# Patient Record
Sex: Male | Born: 1965 | Race: White | Hispanic: No
Health system: Midwestern US, Community
[De-identification: ages and names within clinical notes are randomized; demographics above are authoritative.]

---

## 2013-06-05 NOTE — Progress Notes (Signed)
Review of Systems   Constitutional: Negative.    HENT: Positive for neck pain.    Eyes: Negative.    Respiratory: Negative.    Cardiovascular: Negative.    Gastrointestinal: Negative.    Genitourinary: Negative.    Musculoskeletal: Negative for back pain and joint pain.   Skin: Negative.    Neurological: Negative.    Endo/Heme/Allergies: Negative.    Psychiatric/Behavioral: Negative.        ORTHOPEDIC EVALUATION OF TRAUMA / INJURY    History of Present Illness (HPI):   This is a:  [] Consult Visit     [x] New Patient Visit     [] Established Patient Visit  Requested by self referral  Patient's PCP is No primary provider on file.Kenneth Roberson is a 47 y.o. year old male seen for evaluation after trauma / injury regarding chief complaint of   Chief Complaint   Patient presents with   ??? Knee Pain     L       Informant patient   Setting when problem first occurred The patient had a football injury to his left knee in 1985.  He dislocated his knee and tore his ACL without surgical repair.   Mechanism unknown    Location where pain is most intense Left  General patella   Quality / Description aching   Severity / Intensity 6 on scale of 1-10   Onset 1985   Timing gradually worsening   Radiates does not radiate   Aggravating factors weight bearing, and walking.   Relieving factors avoiding painful activities   Associated manifestations Instability.  Denies erythema or warmth, numbness, tingling, weakness   Previous tests and diagnostic procedures No recent imaging.   Previous problems in this area Dislocation and tore ACL without surgical treatment.   Previous treatment Hinged knee brace.   Effect on patient's life difficulty with walking     He has been treated with bracing over the years, his knee is OK with no new changes, he wants a new brace.      Past Medical History   Diagnosis Date   ??? High cholesterol        No past surgical history on file.    Family History   Problem Relation Age of Onset   ??? Cancer        grandfather   ??? Tuberculosis       grandfather   ??? Diabetes Father    ??? Heart Disease Father    ??? High Blood Pressure Father    ??? Stroke       grandfather   ??? Allergies Child        History     Social History   ??? Marital Status: Divorced     Spouse Name: N/A     Number of Children: N/A   ??? Years of Education: N/A     Social History Main Topics   ??? Smoking status: Never Smoker    ??? Smokeless tobacco: None   ??? Alcohol Use: None   ??? Drug Use: None   ??? Sexual Activity: None     Other Topics Concern   ??? None     Social History Narrative       Current Outpatient Prescriptions   Medication Sig Dispense Refill   ??? rosuvastatin (CRESTOR) 40 MG tablet Take 40 mg by mouth every evening.       ??? aspirin 81 MG tablet Take 81 mg by mouth daily.       ???  esomeprazole Magnesium (NEXIUM) 20 MG PACK Take 20 mg by mouth daily.         No current facility-administered medications for this visit.       No Known Allergies    Review of Systems:  See above      Physical Exam:   Resp 14   Ht 5\' 10"  (1.778 m)   Wt 200 lb (90.719 kg)   BMI 28.7 kg/m2   Patient is alert, appears stated age, cooperative and no distress    Gait is Normal. The patient can bear weight on the injured extremity.     left leg/knee exam:  Leg alignment:     neutral  Quadriceps/hamstring atrophy:   no  Knee effusion:    none   Knee erythema:   no  ROM:     full and equal bilaterally  Lachman:    yes   Pivot Shift:    no  Posterior drawer:   no  Lateral patella glide at 30 deg's: 20%       With no apprehension  Medial patella glide at 30 deg's: 10mm  Increased ER at 30 deg's:  no  Varus laxity at 0 and 30 deg's: no  Valgus laxity at 0 and 30 deg's: no  Recurvatum:    no  Tenderness at:   None     Knee strength is 5/5 flexion and extension    Unless noted above, there is not any erythema, edema or cutaneous lesions of the leg.  There is no calf pain.  There is + L2-S1 motor and sensory function.  The leg is well perfused with a 2+ DP pulse.    Outside record review:  none      Impression:  left knee chronic ACL injury      Plan:  We had a lengthy discussion about the most likely diagnosis.  We also discussed operative and non-operative management options.  The patient would like to proceed with:  Continued bracing    A hinged sleeve brace was fitted/issued to the patient.    F/u prn      The patient and all family members understand and agree with the above, all questions answered.

## 2017-05-12 ENCOUNTER — Ambulatory Visit: Admit: 2017-05-12 | Discharge: 2017-05-16 | Payer: BLUE CROSS/BLUE SHIELD

## 2017-05-12 ENCOUNTER — Ambulatory Visit: Admit: 2017-05-12 | Discharge: 2017-05-12 | Payer: BLUE CROSS/BLUE SHIELD | Attending: Orthopaedic Surgery

## 2017-05-12 DIAGNOSIS — S83512D Sprain of anterior cruciate ligament of left knee, subsequent encounter: Secondary | ICD-10-CM

## 2017-05-12 DIAGNOSIS — R52 Pain, unspecified: Secondary | ICD-10-CM

## 2017-05-12 NOTE — Progress Notes (Signed)
Scribe Authentication Statement  Kenneth Roberson, scribed portions of this documentation for and in the presence of Dr. Madelin RearEric Jacion Dismore,MD on 05/12/17 at 3:02 PM.    Provider Authentication Statement  I, Kenneth Roberson, M.D., personally performed the services described in this documentation and they were scribed in my presence by the above listed scribe.   The documentation is both accurate and complete.       Review of Systems   Constitutional: Negative.    HENT: Negative.    Eyes: Negative.    Respiratory: Negative.    Cardiovascular: Negative.    Gastrointestinal: Negative.    Genitourinary: Negative.    Musculoskeletal: Negative.    Skin: Negative.    Neurological: Negative.    Endo/Heme/Allergies: Negative.    Psychiatric/Behavioral: Negative.          HPI:  Kenneth Roberson is a 51 y.o. year old male who complains of Left knee pain and popping sensation, buckling sensation     The Left knee pain assessment is:   Intensity: 1/10   Location:        patellar   Description:    aching    The symptoms started 33 years ago.  Cause of injury was  Sports injury.  Previous treatment for this episode has included bracing and nsaids.  He wants a new brace and maybe surgery at some point.    Symptoms improve with the use of a brace/sleeve. The symptoms are worse with activity, walking uneven ground.   The progression of symptoms, overall course: waxing and waning. Prior to this episode, knee history is: negative for prior surgery, trauma, arthritis or disorders    Review of previous history:  Patient's PCP is No primary provider on file.Kenneth Roberson.  Kenneth Roberson is a 51 y.o. year old male seen for evaluation after trauma / injury regarding chief complaint of        Chief Complaint   Patient presents with   . Knee Pain     L       Informant patient   Setting when problem first occurred The patient had a football injury to his left knee in 1985.  He dislocated his knee and tore his ACL without surgical repair.   Mechanism unknown     Location where pain is most intense Left  General patella   Quality / Description aching   Severity / Intensity 6 on scale of 1-10   Onset 1985   Timing gradually worsening   Radiates does not radiate   Aggravating factors weight bearing, and walking.   Relieving factors avoiding painful activities   Associated manifestations Instability.  Denies erythema or warmth, numbness, tingling, weakness   Previous tests and diagnostic procedures No recent imaging.   Previous problems in this area Dislocation and tore ACL without surgical treatment.   Previous treatment Hinged knee brace.   Effect on patient's life difficulty with walking     He has been treated with bracing over the years, his knee is OK with no new changes, he wants a new brace.    Past Medical History:   Diagnosis Date   . High cholesterol        No past surgical history on file.    Family History   Problem Relation Age of Onset   . Cancer Unknown         grandfather   . Tuberculosis Unknown         grandfather   . Diabetes Father    .  Heart Disease Father    . High Blood Pressure Father    . Stroke Unknown         grandfather   . Allergies Child        Social History     Social History   . Marital status: Divorced     Spouse name: N/A   . Number of children: N/A   . Years of education: N/A     Social History Main Topics   . Smoking status: Never Smoker   . Smokeless tobacco: Not on file   . Alcohol use Not on file   . Drug use: Unknown   . Sexual activity: Not on file     Other Topics Concern   . Not on file     Social History Narrative   . No narrative on file       Current Outpatient Prescriptions   Medication Sig Dispense Refill   . rosuvastatin (CRESTOR) 40 MG tablet Take 40 mg by mouth every evening.     Marland Kitchen. aspirin 81 MG tablet Take 81 mg by mouth daily.     Marland Kitchen. esomeprazole Magnesium (NEXIUM) 20 MG PACK Take 20 mg by mouth daily.       No current facility-administered medications for this visit.        No Known Allergies    Review of Systems:  See  above      Physical Exam:   There were no vitals taken for this visit.       Gait is Normal. The patient can bear weight on the injured extremity.     Gen/Psych: Examination reveals a pleasant individual in no acute distress. The patient is oriented to time, place and person.  The patient's mood and affect are appropriate.    Lymph: The lymphatic examination bilaterally reveals all areas to be without enlargement or induration.     Skin intact without lymphadenopathy, discoloration, or abnormal temperature.     Vascular: There is intact, symmetric circulation in both lower extremities.    left leg/knee exam:  Leg alignment:     neutral  Quadriceps/hamstring atrophy:   none  Knee effusion:    mild   Knee erythema:   no  ROM:     full  Lachman:    yes  Pivot Shift:    yes - trace  Posterior drawer:   no  Lateral patella glide at 30 deg's: 20%%       With no apprehension  Medial patella glide at 30 deg's: 10mm  Increased ER at 30 deg's:  no  Varus laxity at 0 and 30 deg's: no  Valgus laxity at 0 and 30 deg's: no  Recurvatum:    no  Tenderness at:   None with no McMurray     Knee strength is 5/5 flexion and extension  There is + L2-S1 motor and sensory function.     Outside record review: historical medical records    left knee x-rays, four views,  were obtained and reviewed and show No fracture, Normal alignment, No foreign body and lateral joint narrowing      Impression:  left knee chronic acl tear, mild arthrosis      Plan:    reddi brace fitted and given  Follow up as needed for persistent pain  If surgery considered will need MRI

## 2017-05-12 NOTE — Patient Instructions (Signed)
Plan:    reddi brace fitted and given  Follow up as needed for persistent pain

## 2023-11-14 IMAGING — MR MRI LUMBAR SPINE WITHOUT CONTRAST
6 of 8 series · 12 of 48 positions shown · IV contrast (gadolinium)
Comparison: None

________________________________________________________________________________________________ 
MRI LUMBAR SPINE WITHOUT CONTRAST, 11/14/2023 [DATE]: 
CLINICAL INDICATION: Low back pain, unspecified , chronic low back pain. Surgery 
10 years ago. Bilateral radiculopathy. No trauma.
TECHNIQUE: Multiplanar, multiecho position MR images of the lumbar spine were 
performed without intravenous gadolinium enhancement. Patient was scanned on a 
1.5T magnet

[Series 101: survey · axial · 10.0mm · 1.25mm/px · 1 of 10 slices shown]
[im 1/10]
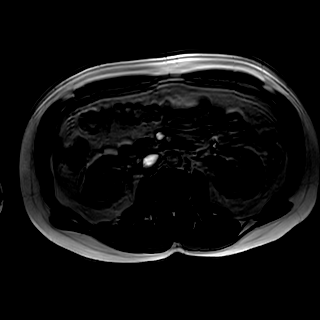

[Series 201: t2w_cor-surv · coronal · 6.0mm · 0.62mm/px · 1 of 10 slices shown]
[im 1/10]
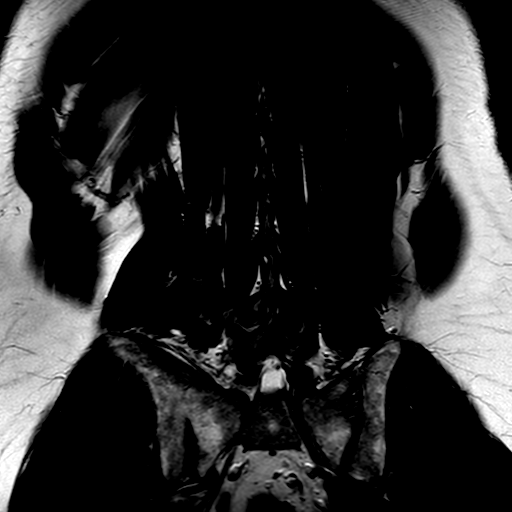

[Series 301: t1_tse_sag · sagittal · 4.0mm · 0.44mm/px · 3 of 22 slices shown]
[im 1/22]
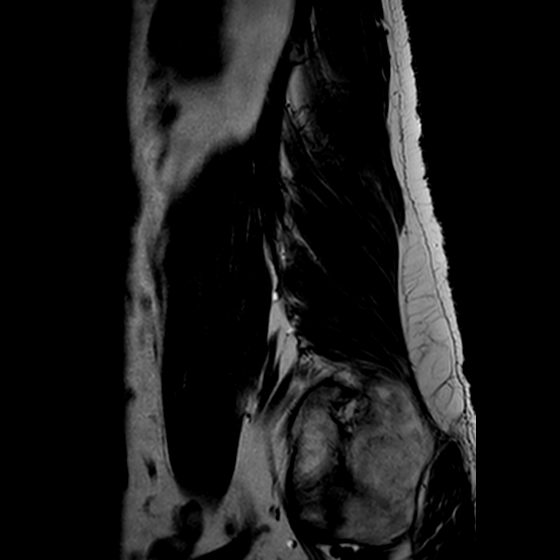
[im 11/22]
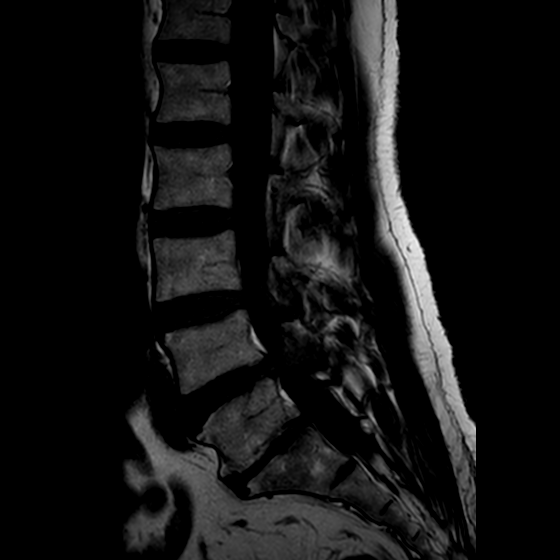
[im 22/22]
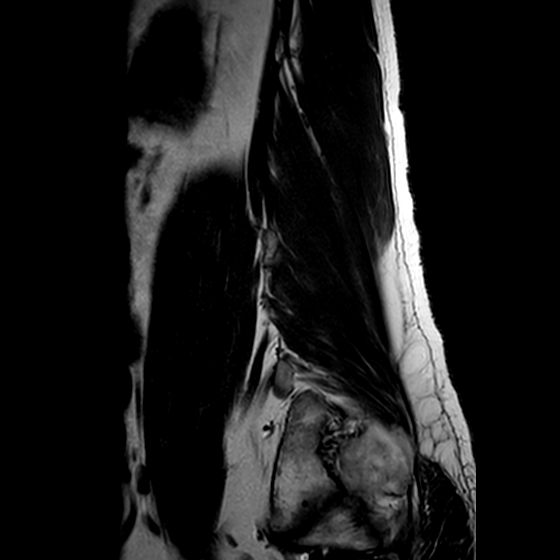

[Series 402: (id)_mdixon_tse · sagittal · 4.0mm · 0.38mm/px · 3 of 22 slices shown]
[im 1/22]
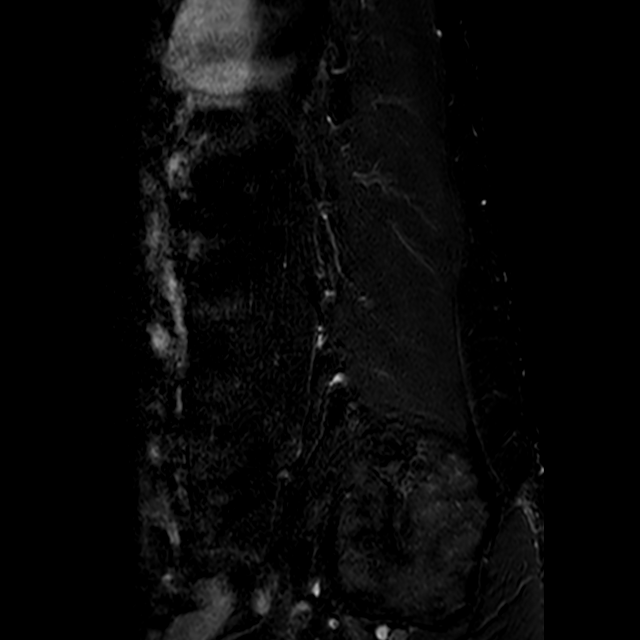
[im 11/22]
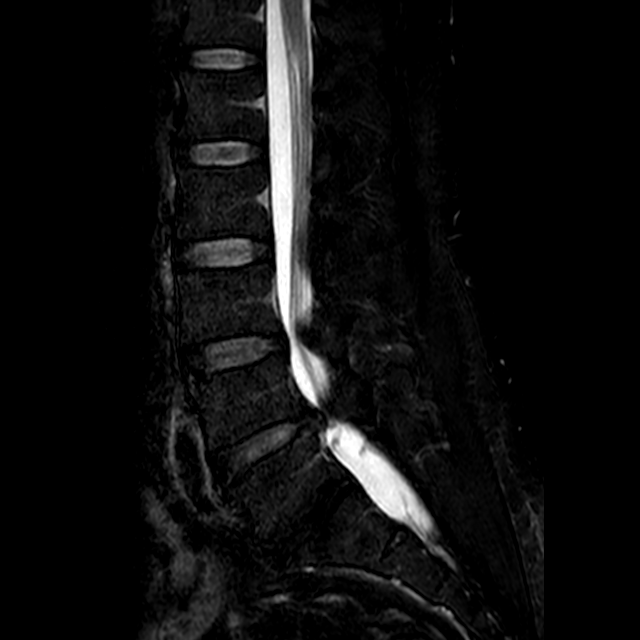
[im 22/22]
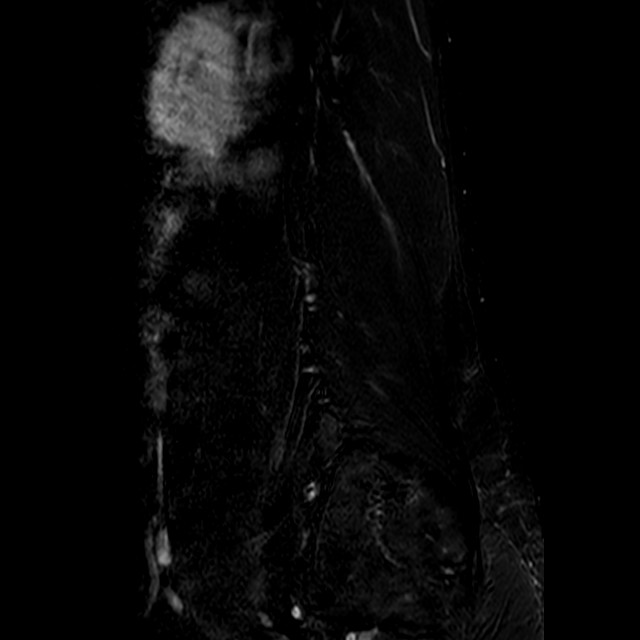

[Series 403: st2w_mdixon_tse · sagittal · 4.0mm · 0.38mm/px · 3 of 22 slices shown]
[im 1/22]
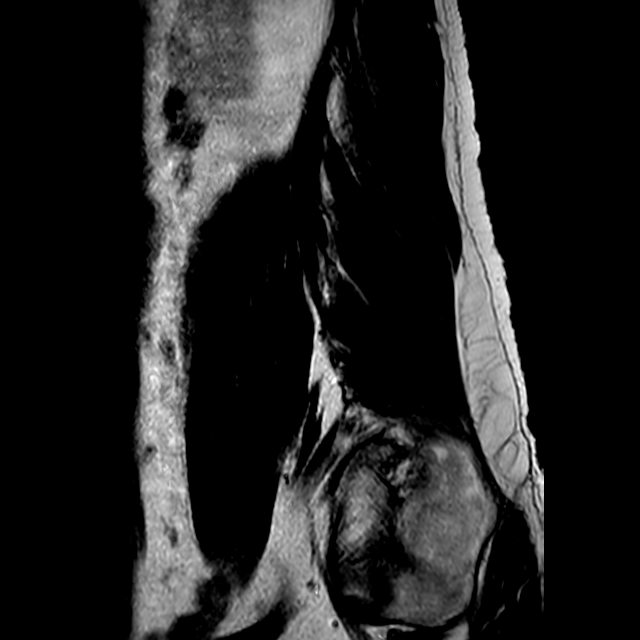
[im 11/22]
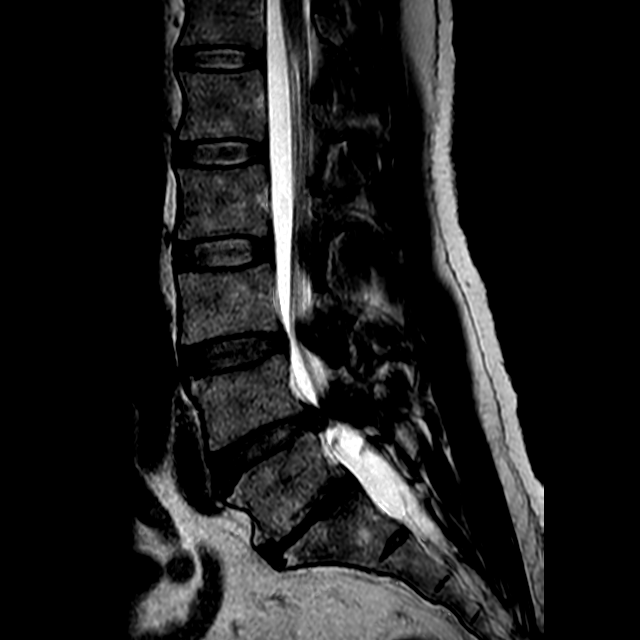
[im 22/22]
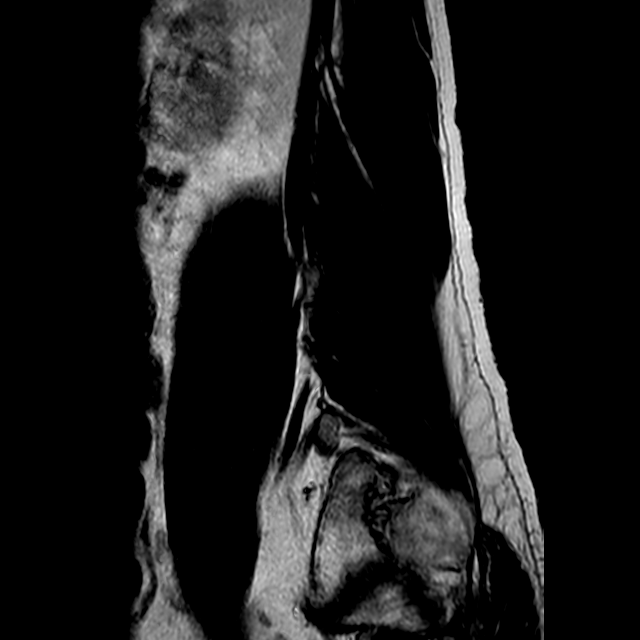

[Series 502: (id) view_ax mpr · axial · 1.0mm · 0.25mm/px · 1 of 130 slices shown]
[im 9/130]
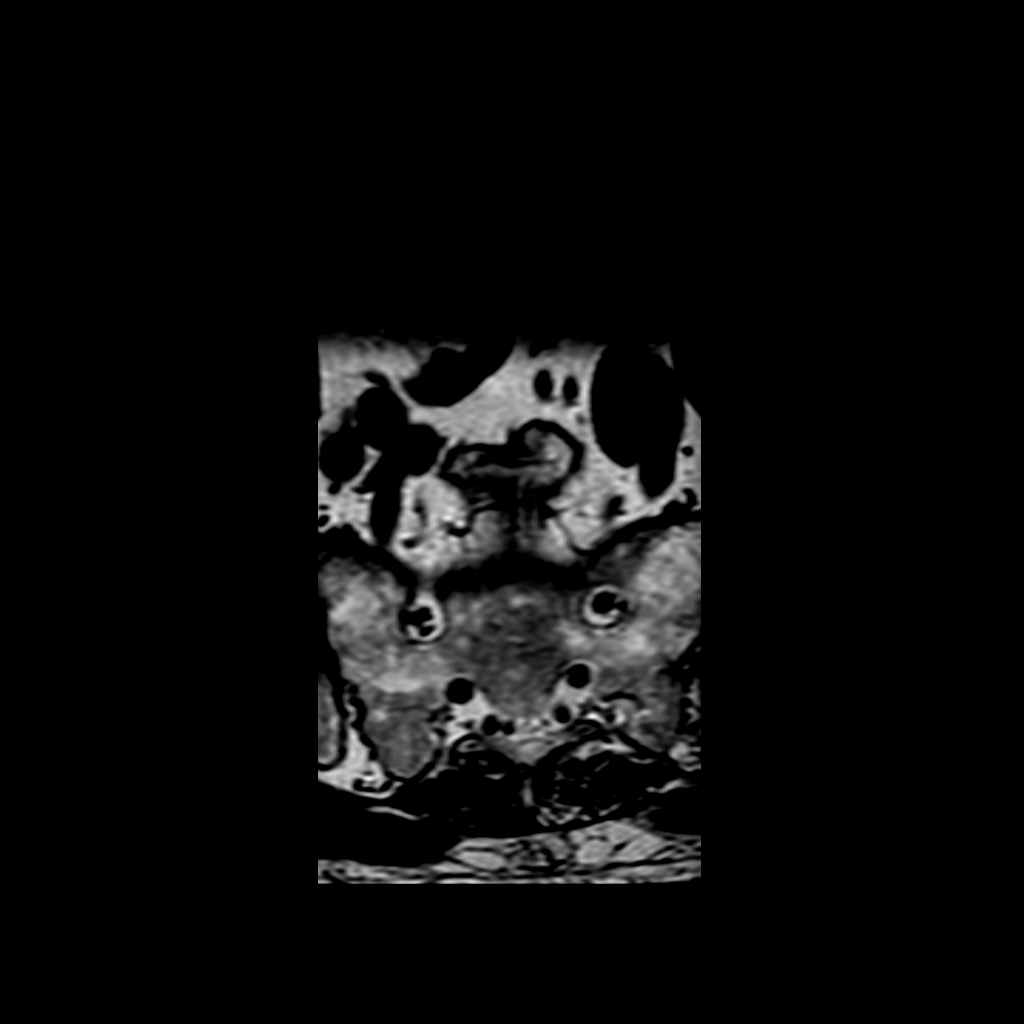

[12 of 48 positions shown; findings below may reference images not displayed]

FINDINGS: There may be previous left hemilaminectomy L5-S1. 
-------------------------------------------------------------------------------- 
------ 
GENERAL: 
Nomenclature is based on 5 lumbar type vertebral bodies.     
ALIGNMENT: Normal coronal alignment. Slight loss of the normal lumbar lordosis. 
VERTEBRAL BODY HEIGHT: Normal.  
MARROW SIGNAL: No focal suspect signal abnormality. 
CORD SIGNAL: Normal distal spinal cord and cauda equina. Conus medullaris 
terminates at T12-L1. 
ADDITIONAL FINDINGS: None. 
Modic I-II: L4-L5, L5-S1. 
Ligamentum Flavum > 2.5 mm: All except possibly on the left at L5-S1. 
-------------------------------------------------------------------------------- 
------ 
SEGMENTAL: 
T12-L1: Normal disc height and signal. No herniation. Normal facets. No spinal 
canal or neural foraminal stenosis. 
L1-L2: Normal disc height and signal. No herniation. Normal facets. No spinal 
canal or neural foraminal stenosis. 
L2-L3: Normal disc height and signal. No herniation. Normal facets. No spinal 
canal or neural foraminal stenosis. 
L3-L4: Loss of disc signal. Mild annular bulge. Facet arthropathy. Mild to 
moderate canal stenosis with mild narrowing of the lateral recesses bilaterally. 
Mild foraminal narrowing bilaterally. 
L4-L5: Slight loss of disc height with loss of disc signal. Annular bulge. 
Superimposed left paracentral disc extrusion with disc material extending 
superiorly and inferiorly. Extrusion measures 8 mm AP by 13 mm craniocaudal. 
Moderately severe canal stenosis with cauda equina compression and significant 
lateral recess narrowing bilaterally. Facet arthropathy. Left foramen patent. 
Mild right foraminal narrowing with evidence of right foraminal disc extrusion. 
L5-S1: Loss of disc height and signal with patent canal and right foramen. Left 
foramen is mildly moderately narrowed. 
-------------------------------------------------------------------------------- 
------
IMPRESSION: Possible previous left hemilaminectomy L5-S1. Correlate with surgical history. 
L4-L5, annular disc bulge with superimposed left paracentral disc extrusion, 
moderately severe canal stenosis with cauda equina compression and significant 
lateral recess narrowing bilaterally. 
Other less significant lumbar degenerative changes detailed above.
